# Patient Record
Sex: Male | Born: 1990 | Race: Black or African American | Hispanic: No | Marital: Single | Smoking: Never smoker
Health system: Southern US, Community
[De-identification: ages and names within clinical notes are randomized; demographics above are authoritative.]

## PROBLEM LIST (undated history)

## (undated) DIAGNOSIS — K219 Gastro-esophageal reflux disease without esophagitis: Secondary | ICD-10-CM

## (undated) DIAGNOSIS — J45909 Unspecified asthma, uncomplicated: Secondary | ICD-10-CM

## (undated) HISTORY — PX: TONSILLECTOMY: SUR1361

---

## 2017-10-27 ENCOUNTER — Encounter

## 2017-10-27 ENCOUNTER — Ambulatory Visit: Payer: Self-pay | Admitting: Allergy

## 2017-12-03 DIAGNOSIS — Z21 Asymptomatic human immunodeficiency virus [HIV] infection status: Secondary | ICD-10-CM | POA: Insufficient documentation

## 2018-01-19 DIAGNOSIS — R030 Elevated blood-pressure reading, without diagnosis of hypertension: Secondary | ICD-10-CM | POA: Insufficient documentation

## 2018-05-07 DIAGNOSIS — K580 Irritable bowel syndrome with diarrhea: Secondary | ICD-10-CM | POA: Insufficient documentation

## 2018-06-03 DIAGNOSIS — F330 Major depressive disorder, recurrent, mild: Secondary | ICD-10-CM | POA: Insufficient documentation

## 2018-07-23 DIAGNOSIS — F900 Attention-deficit hyperactivity disorder, predominantly inattentive type: Secondary | ICD-10-CM | POA: Insufficient documentation

## 2018-11-10 DIAGNOSIS — K219 Gastro-esophageal reflux disease without esophagitis: Secondary | ICD-10-CM | POA: Insufficient documentation

## 2019-01-10 DIAGNOSIS — R55 Syncope and collapse: Secondary | ICD-10-CM | POA: Insufficient documentation

## 2019-05-09 DIAGNOSIS — F4001 Agoraphobia with panic disorder: Secondary | ICD-10-CM | POA: Insufficient documentation

## 2019-07-04 DIAGNOSIS — F603 Borderline personality disorder: Secondary | ICD-10-CM | POA: Insufficient documentation

## 2020-05-29 DIAGNOSIS — M5412 Radiculopathy, cervical region: Secondary | ICD-10-CM | POA: Insufficient documentation

## 2020-05-29 DIAGNOSIS — G5600 Carpal tunnel syndrome, unspecified upper limb: Secondary | ICD-10-CM | POA: Insufficient documentation

## 2021-04-23 ENCOUNTER — Other Ambulatory Visit: Payer: Self-pay

## 2021-04-23 ENCOUNTER — Emergency Department (HOSPITAL_COMMUNITY): Payer: No Typology Code available for payment source

## 2021-04-23 ENCOUNTER — Observation Stay (HOSPITAL_COMMUNITY)
Admission: EM | Admit: 2021-04-23 | Discharge: 2021-04-24 | Disposition: A | Discharge status: Home / Self Care | Payer: No Typology Code available for payment source | Attending: Neurological Surgery | Admitting: Neurological Surgery

## 2021-04-23 ENCOUNTER — Encounter (HOSPITAL_COMMUNITY): Payer: Self-pay

## 2021-04-23 DIAGNOSIS — S066X9A Traumatic subarachnoid hemorrhage with loss of consciousness of unspecified duration, initial encounter: Secondary | ICD-10-CM | POA: Diagnosis not present

## 2021-04-23 DIAGNOSIS — Z20822 Contact with and (suspected) exposure to covid-19: Secondary | ICD-10-CM | POA: Diagnosis not present

## 2021-04-23 DIAGNOSIS — B20 Human immunodeficiency virus [HIV] disease: Secondary | ICD-10-CM | POA: Insufficient documentation

## 2021-04-23 DIAGNOSIS — Z23 Encounter for immunization: Secondary | ICD-10-CM | POA: Diagnosis not present

## 2021-04-23 DIAGNOSIS — J45909 Unspecified asthma, uncomplicated: Secondary | ICD-10-CM | POA: Insufficient documentation

## 2021-04-23 DIAGNOSIS — T1490XA Injury, unspecified, initial encounter: Secondary | ICD-10-CM

## 2021-04-23 DIAGNOSIS — S01112A Laceration without foreign body of left eyelid and periocular area, initial encounter: Secondary | ICD-10-CM | POA: Insufficient documentation

## 2021-04-23 DIAGNOSIS — Y900 Blood alcohol level of less than 20 mg/100 ml: Secondary | ICD-10-CM | POA: Diagnosis not present

## 2021-04-23 DIAGNOSIS — Y92410 Unspecified street and highway as the place of occurrence of the external cause: Secondary | ICD-10-CM | POA: Diagnosis not present

## 2021-04-23 DIAGNOSIS — Z79899 Other long term (current) drug therapy: Secondary | ICD-10-CM | POA: Insufficient documentation

## 2021-04-23 DIAGNOSIS — I609 Nontraumatic subarachnoid hemorrhage, unspecified: Secondary | ICD-10-CM

## 2021-04-23 HISTORY — DX: Unspecified asthma, uncomplicated: J45.909

## 2021-04-23 HISTORY — DX: Gastro-esophageal reflux disease without esophagitis: K21.9

## 2021-04-23 LAB — CBC
HCT: 41.9 % (ref 39.0–52.0)
Hemoglobin: 14.2 g/dL (ref 13.0–17.0)
MCH: 29.2 pg (ref 26.0–34.0)
MCHC: 33.9 g/dL (ref 30.0–36.0)
MCV: 86 fL (ref 80.0–100.0)
Platelets: 167 10*3/uL (ref 150–400)
RBC: 4.87 MIL/uL (ref 4.22–5.81)
RDW: 13.6 % (ref 11.5–15.5)
WBC: 5.2 10*3/uL (ref 4.0–10.5)
nRBC: 0 % (ref 0.0–0.2)

## 2021-04-23 LAB — I-STAT CHEM 8, ED
BUN: 18 mg/dL (ref 6–20)
Calcium, Ion: 1.13 mmol/L — ABNORMAL LOW (ref 1.15–1.40)
Chloride: 105 mmol/L (ref 98–111)
Creatinine, Ser: 1.1 mg/dL (ref 0.61–1.24)
Glucose, Bld: 96 mg/dL (ref 70–99)
HCT: 42 % (ref 39.0–52.0)
Hemoglobin: 14.3 g/dL (ref 13.0–17.0)
Potassium: 3.7 mmol/L (ref 3.5–5.1)
Sodium: 141 mmol/L (ref 135–145)
TCO2: 25 mmol/L (ref 22–32)

## 2021-04-23 LAB — SAMPLE TO BLOOD BANK

## 2021-04-23 LAB — COMPREHENSIVE METABOLIC PANEL
ALT: 45 U/L — ABNORMAL HIGH (ref 0–44)
AST: 33 U/L (ref 15–41)
Albumin: 4.2 g/dL (ref 3.5–5.0)
Alkaline Phosphatase: 56 U/L (ref 38–126)
Anion gap: 8 (ref 5–15)
BUN: 16 mg/dL (ref 6–20)
CO2: 24 mmol/L (ref 22–32)
Calcium: 9.2 mg/dL (ref 8.9–10.3)
Chloride: 103 mmol/L (ref 98–111)
Creatinine, Ser: 1.08 mg/dL (ref 0.61–1.24)
GFR, Estimated: >60 mL/min (ref >60)
Glucose, Bld: 103 mg/dL — ABNORMAL HIGH (ref 70–99)
Potassium: 3.6 mmol/L (ref 3.5–5.1)
Sodium: 135 mmol/L (ref 135–145)
Total Bilirubin: 0.8 mg/dL (ref 0.3–1.2)
Total Protein: 6.8 g/dL (ref 6.5–8.1)

## 2021-04-23 LAB — URINALYSIS, ROUTINE W REFLEX MICROSCOPIC
Bacteria, UA: NONE SEEN
Bilirubin Urine: NEGATIVE
Glucose, UA: NEGATIVE mg/dL
Ketones, ur: NEGATIVE mg/dL
Leukocytes,Ua: NEGATIVE
Nitrite: NEGATIVE
Protein, ur: NEGATIVE mg/dL
Specific Gravity, Urine: 1.012 (ref 1.005–1.030)
pH: 6 (ref 5.0–8.0)

## 2021-04-23 LAB — RESP PANEL BY RT-PCR (FLU A&B, COVID) ARPGX2
Influenza A by PCR: NEGATIVE
Influenza B by PCR: NEGATIVE
SARS Coronavirus 2 by RT PCR: NEGATIVE

## 2021-04-23 LAB — MAGNESIUM: Magnesium: 1.8 mg/dL (ref 1.7–2.4)

## 2021-04-23 LAB — LACTIC ACID, PLASMA: Lactic Acid, Venous: 1.1 mmol/L (ref 0.5–1.9)

## 2021-04-23 LAB — PROTIME-INR
INR: 1 (ref 0.8–1.2)
Prothrombin Time: 13.4 seconds (ref 11.4–15.2)

## 2021-04-23 MED ORDER — FENTANYL CITRATE PF 50 MCG/ML IJ SOSY
50.0000 ug | PREFILLED_SYRINGE | INTRAMUSCULAR | Status: DC | PRN
Start: 2021-04-23 — End: 2021-04-23

## 2021-04-23 MED ORDER — HYDROMORPHONE HCL 1 MG/ML IJ SOLN: 0.5000 mg | INTRAMUSCULAR | Status: DC | PRN

## 2021-04-23 MED ORDER — ACETAMINOPHEN 650 MG RE SUPP: 650.0000 mg | Freq: Four times a day (QID) | RECTAL | Status: DC | PRN

## 2021-04-23 MED ORDER — BICTEGRAVIR-EMTRICITAB-TENOFOV 50-200-25 MG PO TABS
1.0000 | ORAL_TABLET | Freq: Every day | ORAL | Status: DC
  Administered 2021-04-23: 1 via ORAL
  Filled 2021-04-23 (×2): qty 1

## 2021-04-23 MED ORDER — FLEET ENEMA 7-19 GM/118ML RE ENEM: 1.0000 | ENEMA | Freq: Once | RECTAL | Status: DC | PRN

## 2021-04-23 MED ORDER — ACETAMINOPHEN 325 MG PO TABS
650.0000 mg | ORAL_TABLET | Freq: Four times a day (QID) | ORAL | Status: DC | PRN
  Administered 2021-04-23: 650 mg via ORAL
  Filled 2021-04-23: qty 2

## 2021-04-23 MED ORDER — IOHEXOL 300 MG/ML  SOLN
100.0000 mL | Freq: Once | INTRAMUSCULAR | Status: AC | PRN
  Administered 2021-04-23: 100 mL via INTRAVENOUS

## 2021-04-23 MED ORDER — POLYETHYLENE GLYCOL 3350 17 G PO PACK: 17.0000 g | PACK | Freq: Every day | ORAL | Status: DC | PRN

## 2021-04-23 MED ORDER — ACETAMINOPHEN 500 MG PO TABS
1000.0000 mg | ORAL_TABLET | Freq: Once | ORAL | Status: AC
  Administered 2021-04-23: 1000 mg via ORAL
  Filled 2021-04-23: qty 2

## 2021-04-23 MED ORDER — ONDANSETRON HCL 4 MG/2ML IJ SOLN
4.0000 mg | Freq: Four times a day (QID) | INTRAMUSCULAR | Status: DC | PRN
  Administered 2021-04-23: 4 mg via INTRAVENOUS
  Filled 2021-04-23: qty 2

## 2021-04-23 MED ORDER — LACTATED RINGERS IV BOLUS
1000.0000 mL | Freq: Once | INTRAVENOUS | Status: AC
  Administered 2021-04-23: 1000 mL via INTRAVENOUS

## 2021-04-23 MED ORDER — DOCUSATE SODIUM 100 MG PO CAPS
100.0000 mg | ORAL_CAPSULE | Freq: Two times a day (BID) | ORAL | Status: DC
  Administered 2021-04-23 (×2): 100 mg via ORAL
  Filled 2021-04-23 (×2): qty 1

## 2021-04-23 MED ORDER — LEVETIRACETAM IN NACL 500 MG/100ML IV SOLN
500.0000 mg | Freq: Two times a day (BID) | INTRAVENOUS | Status: DC
  Administered 2021-04-23 (×2): 500 mg via INTRAVENOUS
  Filled 2021-04-23 (×2): qty 100

## 2021-04-23 MED ORDER — LIDOCAINE-EPINEPHRINE-TETRACAINE (LET) TOPICAL GEL
3.0000 mL | Freq: Once | TOPICAL | Status: AC
  Administered 2021-04-23: 3 mL via TOPICAL
  Filled 2021-04-23: qty 3

## 2021-04-23 MED ORDER — NORTRIPTYLINE HCL 25 MG PO CAPS
75.0000 mg | ORAL_CAPSULE | Freq: Every day | ORAL | Status: DC
  Administered 2021-04-23: 75 mg via ORAL
  Filled 2021-04-23: qty 3

## 2021-04-23 MED ORDER — BISACODYL 10 MG RE SUPP: 10.0000 mg | Freq: Every day | RECTAL | Status: DC | PRN

## 2021-04-23 MED ORDER — LIDOCAINE-EPINEPHRINE (PF) 2 %-1:200000 IJ SOLN
10.0000 mL | Freq: Once | INTRAMUSCULAR | Status: AC
  Administered 2021-04-23: 10 mL via INTRADERMAL
  Filled 2021-04-23: qty 20

## 2021-04-23 MED ORDER — TETANUS-DIPHTH-ACELL PERTUSSIS 5-2.5-18.5 LF-MCG/0.5 IM SUSY
0.5000 mL | PREFILLED_SYRINGE | Freq: Once | INTRAMUSCULAR | Status: AC
  Administered 2021-04-23: 0.5 mL via INTRAMUSCULAR
  Filled 2021-04-23: qty 0.5

## 2021-04-23 MED ORDER — HYDROCODONE-ACETAMINOPHEN 5-325 MG PO TABS
1.0000 | ORAL_TABLET | ORAL | Status: DC | PRN
  Administered 2021-04-23: 2 via ORAL
  Administered 2021-04-23: 1 via ORAL
  Filled 2021-04-23: qty 1
  Filled 2021-04-23: qty 2

## 2021-04-23 MED ORDER — METHOCARBAMOL 1000 MG/10ML IJ SOLN
500.0000 mg | Freq: Four times a day (QID) | INTRAVENOUS | Status: DC | PRN
  Administered 2021-04-23: 500 mg via INTRAVENOUS
  Filled 2021-04-23: qty 5
  Filled 2021-04-23: qty 500

## 2021-04-23 NOTE — ED Notes (Signed)
Pt ambulatory to and from restroom with steady gait 
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
## 2021-04-23 NOTE — ED Notes (Signed)
Up to b/r, steady gait 
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
## 2021-04-23 NOTE — ED Notes (Signed)
Given water, and BS tabletop fan.

## 2021-04-23 NOTE — ED Notes (Signed)
Resting/ sleeping, arousable to voice, prefers eyes closed, interactive, speech clear, coherent, VSS. Mentions neck tightness, given robaxin IV.

## 2021-04-23 NOTE — ED Notes (Signed)
Alert, NAD, calm, interactive, mentions HA and slight nausea, given meds. Family x2 at [facility]. VSS.
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
## 2021-04-23 NOTE — H&P (Signed)
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 abnormalities. Maxillofacial CT was also unremarkable. In addition to the finding on his [facility], his physical exam revealed a right parietal hematoma, left eyebrow laceration, left periorbital bruising and swelling, and a small superficial laceration to his right proximal anterior shin.    While in the ED, he has remained neurologically intact and at his baseline. His only complaints are of left periorbital pain and mild neck pain. He is unable to open his left eye due to the periorbital edema. Patient will be admitted for overnight observation with frequent neuro checks and repeat CTH in the morning. If his neurological exam and imaging remain unchanged, he will be appropriate for discharge tomorrow morning.    -Admit to inpatient  for observation -Frequent neuro checks  -Repeat head CT in the morning -Keppra 500 mg IVPB Q12H  Marvis Moeller, NP 04/23/2021,  11:36 AM

## 2021-04-23 NOTE — ED Notes (Signed)
Pt to ct 

## 2021-04-23 NOTE — ED Notes (Signed)
Denies HA, pain or nausea. Visiting with 2 new visitors at [facility].

## 2021-04-24 ENCOUNTER — Observation Stay (HOSPITAL_COMMUNITY): Payer: No Typology Code available for payment source

## 2021-04-24 LAB — ETHANOL: Alcohol, Ethyl (B): <10 mg/dL (ref <10)
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
## 2021-04-24 MED ORDER — METHOCARBAMOL 500 MG PO TABS: 500.0000 mg | ORAL_TABLET | Freq: Four times a day (QID) | ORAL | 0 refills | Status: DC

## 2021-04-24 MED ORDER — LEVETIRACETAM 500 MG PO TABS: 500.0000 mg | ORAL_TABLET | Freq: Two times a day (BID) | ORAL | 0 refills | Status: DC

## 2021-04-24 NOTE — ED Notes (Signed)
Attempted report 
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
## 2021-04-24 NOTE — Progress Notes (Signed)
Pt with discharge orders, discharge paperwork reviewed with patient and all questions answered. IV's removed. Pt taken down via wheelchair with all belongings.   °

## 2021-04-24 NOTE — Progress Notes (Signed)
Pt. Off floor with CT

## 2021-04-24 NOTE — TOC CAGE-AID Note (Signed)
Transition of Care [facility]) - CAGE-AID Screening   Patient Details  Name: Charles Dixon MRN: [id number] Date of Birth: Sep 12, 1990  Transition of Care [facility]) CM/SW Contact:    Lossie Faes Tarpley-Carter, LCSWA Phone Number: 04/24/2021, 12:10 PM   Clinical Narrative: Pt participated in Cage-Aid.  Pt stated he does not use substance, but drinks ETOH. Pt was offered resources.  Pt stated they did not feel that they were in need of resources at this time.    Criag Wicklund Tarpley-Carter, MSW, LCSW-A Pronouns:  She/Her/Hers [facility] HealthTransitions of Care Clinical Social Worker Direct Number:  [phone] [email or web address]  CAGE-AID Screening:    Have You Ever Felt You Ought to Cut Down on Your Drinking or Drug Use?: No Have People Annoyed You By [employer] Your Drinking Or Drug Use?: No Have You Felt Bad Or Guilty About Your Drinking Or Drug Use?: No Have You Ever Had a Drink or Used Drugs First Thing In The Morning to Steady Your Nerves or to Get Rid of a Hangover?: No CAGE-AID Score: 0  Substance Abuse Education Offered: Yes  Substance abuse interventions: Transport planner

## 2021-05-02 ENCOUNTER — Telehealth: Payer: Self-pay | Admitting: Family Medicine
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
## 2021-05-08 ENCOUNTER — Other Ambulatory Visit: Payer: Self-pay

## 2021-05-08 ENCOUNTER — Ambulatory Visit (INDEPENDENT_AMBULATORY_CARE_PROVIDER_SITE_OTHER): Payer: Self-pay | Admitting: Family Medicine

## 2021-05-08 ENCOUNTER — Encounter: Payer: Self-pay | Admitting: Family Medicine

## 2021-05-08 VITALS — BP 104/68 | HR 87 | Ht 73.0 in | Wt 265.6 lb

## 2021-05-08 DIAGNOSIS — R42 Dizziness and giddiness: Secondary | ICD-10-CM

## 2021-05-08 DIAGNOSIS — S060X9D Concussion with loss of consciousness of unspecified duration, subsequent encounter: Secondary | ICD-10-CM

## 2021-05-08 DIAGNOSIS — H539 Unspecified visual disturbance: Secondary | ICD-10-CM

## 2021-05-08 DIAGNOSIS — I609 Nontraumatic subarachnoid hemorrhage, unspecified: Secondary | ICD-10-CM

## 2021-05-08 DIAGNOSIS — S060X9A Concussion with loss of consciousness of unspecified duration, initial encounter: Secondary | ICD-10-CM | POA: Insufficient documentation

## 2021-05-08 MED ORDER — TRAZODONE HCL 50 MG PO TABS: 50.0000 mg | ORAL_TABLET | Freq: Every day | ORAL | 1 refills | Status: AC

## 2021-05-08 NOTE — Patient Instructions (Addendum)
Nice to meet you.  I've referred you to both speech and vestibular therapy at the [facility].  They will call you to schedule but let us know if you don't hear from them in the next week regarding scheduling.  Trazdone 50 mg at bedtime as needed.  Follow-up: 3 weeks

## 2021-05-08 NOTE — Addendum Note (Signed)
Addended by: Rodolph Bong on: 05/08/2021 09:56 AM   Modules accepted: Orders

## 2021-05-24 ENCOUNTER — Ambulatory Visit: Payer: PRIVATE HEALTH INSURANCE | Attending: Family Medicine

## 2021-05-24 ENCOUNTER — Other Ambulatory Visit: Payer: Self-pay

## 2021-05-24 ENCOUNTER — Ambulatory Visit: Payer: PRIVATE HEALTH INSURANCE

## 2021-05-24 DIAGNOSIS — R4701 Aphasia: Secondary | ICD-10-CM

## 2021-05-24 DIAGNOSIS — R41841 Cognitive communication deficit: Secondary | ICD-10-CM | POA: Diagnosis present

## 2021-05-24 DIAGNOSIS — R42 Dizziness and giddiness: Secondary | ICD-10-CM | POA: Diagnosis present

## 2021-05-24 DIAGNOSIS — S060X9D Concussion with loss of consciousness of unspecified duration, subsequent encounter: Secondary | ICD-10-CM | POA: Insufficient documentation

## 2021-05-24 DIAGNOSIS — R293 Abnormal posture: Secondary | ICD-10-CM | POA: Diagnosis present

## 2021-05-24 DIAGNOSIS — R2681 Unsteadiness on feet: Secondary | ICD-10-CM | POA: Insufficient documentation

## 2021-05-24 DIAGNOSIS — R2689 Other abnormalities of gait and mobility: Secondary | ICD-10-CM | POA: Insufficient documentation
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
## 2021-05-27 ENCOUNTER — Ambulatory Visit: Payer: PRIVATE HEALTH INSURANCE

## 2021-05-29 ENCOUNTER — Ambulatory Visit: Payer: Medicaid Other | Admitting: Family Medicine

## 2021-05-31 ENCOUNTER — Ambulatory Visit: Payer: Medicaid Other | Admitting: Family Medicine

## 2021-05-31 ENCOUNTER — Ambulatory Visit: Payer: PRIVATE HEALTH INSURANCE | Admitting: Physical Therapy

## 2021-05-31 ENCOUNTER — Ambulatory Visit: Payer: PRIVATE HEALTH INSURANCE

## 2021-05-31 NOTE — Progress Notes (Deleted)
Subjective:    Chief Complaint: Charles Dixon,  is a 30 y.o. male who presents for Concussion  ***  Injury date : *** Visit #: ***   History of Present Illness:    Concussion Self-Reported Symptom Score Symptoms rated on a scale 1-6, in last 24 hours   Headache: ***    Nausea: ***  Dizziness: ***  Vomiting: ***  Balance Difficulty: ***   Trouble Falling Asleep: ***   Fatigue: ***  Sleep Less Than Usual: ***  Daytime Drowsiness: ***  Sleep More Than Usual: ***  Photophobia: ***  Phonophobia: ***  Irritability: ***  Sadness: ***  Numbness or Tingling: ***  Nervousness: ***  Feeling More Emotional: ***  Feeling Mentally Foggy: ***  Feeling Slowed Down: ***  Memory Problems: ***  Difficulty Concentrating: ***  Visual Problems: ***   Total Symptom Score: *** Previous Symptom Score: ***   Neck Pain: Yes/No  Tinnitus: Yes/No  Review of Systems:  ***    Review of History: ***  Objective:    Physical Examination There were no vitals filed for this visit. MSK:  *** Neuro: *** Psych: ***     Imaging:  ***  Assessment and Plan   30 y.o. male with ***    ***    Action/Discussion: Reviewed diagnosis, management options, expected outcomes, and the reasons for scheduled and emergent follow-up. Questions were adequately answered. Patient expressed verbal understanding and agreement with the following plan.     Patient Education: Reviewed with patient the risks (i.e, a repeat concussion, post-concussion syndrome, second-impact syndrome) of returning to play prior to complete resolution, and thoroughly reviewed the signs and symptoms of concussion.Reviewed need for complete resolution of all symptoms, with rest AND exertion, prior to return to play. Reviewed red flags for urgent medical evaluation: worsening symptoms, nausea/vomiting, intractable headache, musculoskeletal changes, focal neurological deficits. [facility] Concussion Care  Plan, which clearly outlines the plans stated above, was given to patient.   Level of service: ***     After Visit Summary printed out and provided to patient as appropriate.  The above documentation has been reviewed and is accurate and complete Wilford Grist

## 2021-06-05 ENCOUNTER — Ambulatory Visit: Payer: PRIVATE HEALTH INSURANCE

## 2021-06-05 ENCOUNTER — Other Ambulatory Visit: Payer: Self-pay

## 2021-06-05 NOTE — Therapy (Addendum)
[facility] [address] [place], [place], [address] Phone: [phone]   Fax:  [phone]  Patient Details  Name: Charles Dixon MRN: [id number] Date of Birth: 1990/08/21 Referring Provider:  Dr Denyse Amass  Encounter Date: 06/05/2021  Called phone number provided in the chart due to 2 recent no-shows for both ST and PT. No answer, so SLP left voicemail. SLP provided information about clinic no-show policy. Informed that 3 no-shows would result in remaining appointments being cancelled and pt would need to schedule them one at a time from that point. SLP provided next scheduled visits. SLP stated option to call front office to re-schedule remaining appointments for different times/dates if current scheduled times were no longer ideal for them. Recommended return phone call to front office staff re: scheduling.  Janann Colonel, CCC-SLP 06/05/2021, 9:11 AM  [facility] [address] [place], [place], [address] Phone: [phone]   Fax:  [phone]

## 2021-06-06 ENCOUNTER — Ambulatory Visit: Payer: PRIVATE HEALTH INSURANCE

## 2021-06-14 ENCOUNTER — Encounter: Payer: No Typology Code available for payment source | Admitting: Physical Therapy

## 2021-06-19 ENCOUNTER — Ambulatory Visit: Payer: No Typology Code available for payment source

## 2021-06-21 ENCOUNTER — Ambulatory Visit: Payer: No Typology Code available for payment source

## 2021-06-25 ENCOUNTER — Ambulatory Visit: Payer: No Typology Code available for payment source

## 2021-06-28 ENCOUNTER — Encounter: Payer: No Typology Code available for payment source | Admitting: Physical Therapy

## 2021-07-03 ENCOUNTER — Ambulatory Visit: Payer: No Typology Code available for payment source

## 2021-07-04 ENCOUNTER — Ambulatory Visit: Payer: No Typology Code available for payment source

## 2021-07-08 ENCOUNTER — Encounter: Payer: No Typology Code available for payment source | Admitting: Physical Therapy

## 2021-07-12 ENCOUNTER — Encounter: Payer: No Typology Code available for payment source | Admitting: Physical Therapy

## 2021-07-17 ENCOUNTER — Ambulatory Visit: Payer: No Typology Code available for payment source

## 2021-07-18 ENCOUNTER — Encounter: Payer: No Typology Code available for payment source | Admitting: Physical Therapy

## 2022-02-14 ENCOUNTER — Ambulatory Visit: Payer: No Typology Code available for payment source | Admitting: Family Medicine
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
## 2022-06-18 ENCOUNTER — Emergency Department (HOSPITAL_COMMUNITY): Payer: 59

## 2022-06-18 ENCOUNTER — Emergency Department (HOSPITAL_COMMUNITY)
Admission: EM | Admit: 2022-06-18 | Discharge: 2022-06-18 | Disposition: A | Discharge status: Home / Self Care | Payer: 59 | Attending: Emergency Medicine | Admitting: Emergency Medicine

## 2022-06-18 ENCOUNTER — Other Ambulatory Visit: Payer: Self-pay

## 2022-06-18 DIAGNOSIS — K529 Noninfective gastroenteritis and colitis, unspecified: Secondary | ICD-10-CM | POA: Insufficient documentation

## 2022-06-18 DIAGNOSIS — R1010 Upper abdominal pain, unspecified: Secondary | ICD-10-CM | POA: Diagnosis present

## 2022-06-18 LAB — COMPREHENSIVE METABOLIC PANEL
ALT: 20 U/L (ref 0–44)
AST: 23 U/L (ref 15–41)
Albumin: 4.2 g/dL (ref 3.5–5.0)
Alkaline Phosphatase: 37 U/L — ABNORMAL LOW (ref 38–126)
Anion gap: 6 (ref 5–15)
BUN: 17 mg/dL (ref 6–20)
CO2: 23 mmol/L (ref 22–32)
Calcium: 8.9 mg/dL (ref 8.9–10.3)
Chloride: 107 mmol/L (ref 98–111)
Creatinine, Ser: 1.03 mg/dL (ref 0.61–1.24)
GFR, Estimated: >60 mL/min (ref >60)
Glucose, Bld: 104 mg/dL — ABNORMAL HIGH (ref 70–99)
Potassium: 3.9 mmol/L (ref 3.5–5.1)
Sodium: 136 mmol/L (ref 135–145)
Total Bilirubin: 1.3 mg/dL — ABNORMAL HIGH (ref 0.3–1.2)
Total Protein: 6.8 g/dL (ref 6.5–8.1)

## 2022-06-18 LAB — URINALYSIS, ROUTINE W REFLEX MICROSCOPIC
Bilirubin Urine: NEGATIVE
Glucose, UA: NEGATIVE mg/dL
Ketones, ur: NEGATIVE mg/dL
Nitrite: NEGATIVE
Protein, ur: 30 mg/dL — AB
RBC / HPF: >50 RBC/hpf — ABNORMAL HIGH (ref 0–5)
Specific Gravity, Urine: 1.019 (ref 1.005–1.030)
pH: 5 (ref 5.0–8.0)

## 2022-06-18 LAB — CBC WITH DIFFERENTIAL/PLATELET
Abs Immature Granulocytes: 0.01 10*3/uL (ref 0.00–0.07)
Basophils Absolute: 0 10*3/uL (ref 0.0–0.1)
Basophils Relative: 0 %
Eosinophils Absolute: 0.1 10*3/uL (ref 0.0–0.5)
Eosinophils Relative: 2 %
HCT: 43.2 % (ref 39.0–52.0)
Hemoglobin: 14.4 g/dL (ref 13.0–17.0)
Immature Granulocytes: 0 %
Lymphocytes Relative: 9 %
Lymphs Abs: 0.4 10*3/uL — ABNORMAL LOW (ref 0.7–4.0)
MCH: 29 pg (ref 26.0–34.0)
MCHC: 33.3 g/dL (ref 30.0–36.0)
MCV: 86.9 fL (ref 80.0–100.0)
Monocytes Absolute: 0.3 10*3/uL (ref 0.1–1.0)
Monocytes Relative: 7 %
Neutro Abs: 3.3 10*3/uL (ref 1.7–7.7)
Neutrophils Relative %: 82 %
Platelets: 164 10*3/uL (ref 150–400)
RBC: 4.97 MIL/uL (ref 4.22–5.81)
RDW: 13.2 % (ref 11.5–15.5)
WBC: 4 10*3/uL (ref 4.0–10.5)
nRBC: 0 % (ref 0.0–0.2)

## 2022-06-18 LAB — LIPASE, BLOOD: Lipase: 24 U/L (ref 11–51)

## 2022-06-18 MED ORDER — ONDANSETRON 4 MG PO TBDP
4.0000 mg | ORAL_TABLET | Freq: Once | ORAL | Status: AC
  Administered 2022-06-18: 4 mg via ORAL
  Filled 2022-06-18: qty 1

## 2022-06-18 MED ORDER — LACTATED RINGERS IV BOLUS
1000.0000 mL | Freq: Once | INTRAVENOUS | Status: AC
  Administered 2022-06-18: 1000 mL via INTRAVENOUS

## 2022-06-18 MED ORDER — ONDANSETRON HCL 4 MG/2ML IJ SOLN
4.0000 mg | Freq: Once | INTRAMUSCULAR | Status: AC
  Administered 2022-06-18: 4 mg via INTRAVENOUS
  Filled 2022-06-18: qty 2

## 2022-06-18 MED ORDER — IOHEXOL 350 MG/ML SOLN
75.0000 mL | Freq: Once | INTRAVENOUS | Status: AC | PRN
  Administered 2022-06-18: 75 mL via INTRAVENOUS

## 2022-06-18 MED ORDER — ONDANSETRON 4 MG PO TBDP: 4.0000 mg | ORAL_TABLET | Freq: Three times a day (TID) | ORAL | 0 refills | Status: AC | PRN

## 2022-06-18 NOTE — ED Triage Notes (Signed)
Pt. Stated, I woke up around 400 with N/V/D and My stomach is hurting.

## 2022-06-18 NOTE — ED Provider Notes (Signed)
Gastric pain vomiting and diarrhea.  CT unremarkable.  Plan for p.o. challenge and reassess for potential discharge. Physical Exam  BP 115/70   Pulse 80   Temp 98.7 F (37.1 C)   Resp 16   Ht 6' (1.829 m)   Wt 130.6 kg   SpO2 98%   BMI 39.06 kg/m   Physical Exam  Procedures  Procedures  ED Course / MDM    Medical Decision Making Amount and/or Complexity of Data Reviewed Labs: ordered. Radiology: ordered.  Risk Prescription drug management.   Patient is tolerating oral intake and feels much better.  Requesting discharge at this time.       Arby Barrette, MD 06/18/22 [phone]

## 2022-06-18 NOTE — ED Notes (Signed)
Patient verbalizes understanding of discharge instructions. Opportunity for questioning and answers were provided. Pt discharged from ED. 

## 2022-06-18 NOTE — Discharge Instructions (Signed)
It appears you have a viral gastroenteritis.  Start with a clear liquid diet and advance slowly as tolerated.  Take the nausea medication as prescribed.  Follow-up with your doctor for recheck if you are not improving.  Return to the ED with worsening symptoms or unable to eat or drink

## 2022-06-18 NOTE — ED Provider Triage Note (Signed)
Emergency Medicine Provider Triage Evaluation Note  Charles Dixon , a 31 y.o. male  was evaluated in triage.  Pt complains of abdominal pain with vomiting and diarrhea since 4 AM.  Felt well going to bed last night.  Emesis is nonbloody.  History of HIV with undetectable counts..  Review of Systems  Positive: Vomiting, diarrhea, upper abdominal pain Negative: Fever, travel or sick contacts  Physical Exam  BP 131/82   Pulse 90   Temp 98.3 F (36.8 C)   Resp 20   SpO2 99%  Gen:   Awake, no distress   Resp:  Normal effort  MSK:   Moves extremities without difficulty  Other:  Epigastric tenderness, no guarding or rebound  Medical Decision Making  Medically screening exam initiated at 9:00 AM.  Appropriate orders placed.  Charles Dixon was informed that the remainder of the evaluation will be completed by another provider, this initial triage assessment does not replace that evaluation, and the importance of remaining in the ED until their evaluation is complete.     Charles Octave, MD 06/18/22 [phone]

## 2022-06-18 NOTE — ED Provider Notes (Signed)
[facility] EMERGENCY DEPARTMENT Provider Note   CSN: [id number] Arrival date & time: 06/18/22  1660     History  Chief Complaint  Patient presents with   Abdominal Pain   Emesis   Nausea   Diarrhea    Charles Dixon is a 31 y.o. male.  Patient with a history of HIV with undetectable counts by his report presenting with upper abdominal pain with vomiting and diarrhea since 4 AM.  States he vomited 4-5 times that was nonbloody and nonbilious.  Also had multiple episodes of diarrhea as well.  Diffuse crampy abdominal pain.  No further vomiting since initially seen in triage at about 9 AM.  No fever.  Still having upper abdominal pain and nausea.  No travel or sick contacts.  No recent antibiotic use.  No previous abdominal surgeries.  No chest pain or shortness of breath.  No pain with urination or blood in the urine.  No known fever.  The history is provided by the patient.  Abdominal Pain Associated symptoms: diarrhea, nausea and vomiting   Associated symptoms: no cough, no dysuria, no fatigue, no hematuria and no shortness of breath   Emesis Associated symptoms: abdominal pain and diarrhea   Associated symptoms: no arthralgias, no cough, no headaches and no myalgias   Diarrhea Associated symptoms: abdominal pain and vomiting   Associated symptoms: no arthralgias, no headaches and no myalgias        Home Medications Prior to Admission medications   Medication Sig Start Date End Date Taking? Authorizing Provider  amphetamine-dextroamphetamine (ADDERALL XR) 20 MG 24 hr capsule Take 40 mg by mouth every morning. 02/19/21   [provider]  bictegravir-emtricitabine-tenofovir AF (BIKTARVY) 50-200-25 MG TABS tablet Take 1 tablet by mouth daily.    [provider]  DANDELION ROOT PO Take 2 capsules by mouth daily.    [provider]  FIBER PO Take 1 capsule by mouth in the morning.    [provider]  Ginger, Zingiber  officinalis, (GINGER ROOT) 500 MG CAPS Take 2 capsules by mouth daily.    [provider]  Misc Natural Products (OSTEO BI-FLEX ADV DOUBLE ST PO) Take 2 capsules by mouth daily.    [provider]  nortriptyline (PAMELOR) 75 MG capsule Take 75 mg by mouth at bedtime. 03/17/21   [provider]  OVER THE COUNTER MEDICATION Take 2 capsules by mouth daily. Marshmallow Root    [provider]  OVER THE COUNTER MEDICATION Take 2 capsules by mouth in the morning and at bedtime. [facility]    [provider]  OVER THE COUNTER MEDICATION Take 1 capsule by mouth in the morning and at bedtime. Neem    [provider]  OVER THE COUNTER MEDICATION Take 2 capsules by mouth daily. [facility]    [provider]  OVER THE COUNTER MEDICATION Take 2 capsules by mouth daily. Nettle    [provider]  OVER THE COUNTER MEDICATION Take 2 capsules by mouth daily. Burdock Root    [provider]  RED CLOVER LEAF EXTRACT PO Take 2 capsules by mouth daily.    [provider]  traZODone (DESYREL) 50 MG tablet Take 1 tablet (50 mg total) by mouth at bedtime. 05/08/21   Rodolph Bong, MD      Allergies    Penicillins and Other    Review of Systems   Review of Systems  Constitutional:  Positive for activity change and appetite  change. Negative for fatigue.  HENT:  Negative for congestion and rhinorrhea.   Respiratory:  Negative for cough, chest tightness and shortness of breath.   Gastrointestinal:  Positive for abdominal pain, diarrhea, nausea and vomiting.  Genitourinary:  Negative for dysuria and hematuria.  Musculoskeletal:  Negative for arthralgias and myalgias.  Neurological:  Negative for weakness and headaches.   all other systems are negative except as noted in the HPI and PMH.    Physical Exam Updated Vital Signs BP 116/76 (BP Location: Right Arm)   Pulse 84   Temp 98.7 F (37.1 C)   Resp 16   Ht 6'  (1.829 m)   Wt 130.6 kg   SpO2 98%   BMI 39.06 kg/m  Physical Exam Vitals and nursing note reviewed.  Constitutional:      General: He is not in acute distress.    Appearance: He is well-developed.  HENT:     Head: Normocephalic and atraumatic.     Mouth/Throat:     Pharynx: No oropharyngeal exudate.  Eyes:     Conjunctiva/sclera: Conjunctivae normal.     Pupils: Pupils are equal, round, and reactive to light.  Neck:     Comments: No meningismus. Cardiovascular:     Rate and Rhythm: Normal rate and regular rhythm.     Heart sounds: Normal heart sounds. No murmur heard. Pulmonary:     Effort: Pulmonary effort is normal. No respiratory distress.     Breath sounds: Normal breath sounds.  Abdominal:     Palpations: Abdomen is soft.     Tenderness: There is abdominal tenderness. There is no guarding or rebound.     Comments: Right upper quadrant and epigastric tenderness, no guarding or rebound  Musculoskeletal:        General: No tenderness. Normal range of motion.     Cervical back: Normal range of motion and neck supple.  Skin:    General: Skin is warm.  Neurological:     Mental Status: He is alert and oriented to person, place, and time.     Cranial Nerves: No cranial nerve deficit.     Motor: No abnormal muscle tone.     Coordination: Coordination normal.     Comments: No ataxia on finger to nose bilaterally. No pronator drift. 5/5 strength throughout. CN 2-12 intact.Equal grip strength. Sensation intact.   Psychiatric:        Behavior: Behavior normal.     ED Results / Procedures / Treatments   Labs (all labs ordered are listed, but only abnormal results are displayed) Labs Reviewed  CBC WITH DIFFERENTIAL/PLATELET - Abnormal; Notable for the following components:      Result Value   Lymphs Abs 0.4 (*)    All other components within normal limits  COMPREHENSIVE METABOLIC PANEL - Abnormal; Notable for the following components:   Glucose, Bld 104 (*)    Alkaline  Phosphatase 37 (*)    Total Bilirubin 1.3 (*)    All other components within normal limits  URINALYSIS, ROUTINE W REFLEX MICROSCOPIC - Abnormal; Notable for the following components:   APPearance HAZY (*)    Hgb urine dipstick LARGE (*)    Protein, ur 30 (*)    Leukocytes,Ua TRACE (*)    RBC / HPF >50 (*)    Bacteria, UA FEW (*)    All other components within normal limits  URINE CULTURE  LIPASE, BLOOD    EKG None  Radiology No results found.  Procedures Procedures  Medications Ordered in ED Medications  lactated ringers bolus 1,000 mL (has no administration in time range)  ondansetron (ZOFRAN-ODT) disintegrating tablet 4 mg (4 mg Oral Given 06/18/22 0923)  ondansetron (ZOFRAN) injection 4 mg (4 mg Intravenous Given 06/18/22 1544)    ED Course/ Medical Decision Making/ A&P                           Medical Decision Making Amount and/or Complexity of Data Reviewed Labs: ordered. Decision-making details documented in ED Course. Radiology: ordered and independent interpretation performed. Decision-making details documented in ED Course. ECG/medicine tests: ordered and independent interpretation performed. Decision-making details documented in ED Course.  Risk Prescription drug management.  Upper abdominal pain with nausea vomiting diarrhea since 4 AM.  Vital stable, no distress, abdomen soft without peritoneal signs.  Patient be given IV fluids and antiemetics.  Labs are reassuring with no leukocytosis.  LFTs and lipase are normal.  UA with blood and bacteria, will send culture, no UTI symptoms.  Suspect likely viral gastroenteritis.  Will obtain imaging given patient's immunocompromise state.  P.o. challenge and CT scan pending at shift change.  Dr. Donnald Garre to assume care         Final Clinical Impression(s) / ED Diagnoses Final diagnoses:  Gastroenteritis    Rx / DC Orders ED Discharge Orders     None         Ephram Kornegay, Jeannett Senior, MD 06/18/22  1643

## 2022-06-18 NOTE — ED Notes (Signed)
Pt tolerating PO fluids

## 2022-06-18 NOTE — ED Notes (Signed)
Pt given beverage for fluid challenge

## 2022-06-19 LAB — URINE CULTURE: Culture: <10000 — AB

## 2023-01-13 ENCOUNTER — Emergency Department (HOSPITAL_COMMUNITY)
Admission: EM | Admit: 2023-01-13 | Discharge: 2023-01-14 | Disposition: A | Discharge status: Home / Self Care | Payer: 59 | Attending: Student | Admitting: Student

## 2023-01-13 ENCOUNTER — Other Ambulatory Visit: Payer: Self-pay

## 2023-01-13 DIAGNOSIS — Z21 Asymptomatic human immunodeficiency virus [HIV] infection status: Secondary | ICD-10-CM | POA: Insufficient documentation

## 2023-01-13 DIAGNOSIS — R112 Nausea with vomiting, unspecified: Secondary | ICD-10-CM

## 2023-01-13 DIAGNOSIS — R8289 Other abnormal findings on cytological and histological examination of urine: Secondary | ICD-10-CM | POA: Diagnosis not present

## 2023-01-13 DIAGNOSIS — N179 Acute kidney failure, unspecified: Secondary | ICD-10-CM | POA: Insufficient documentation

## 2023-01-13 DIAGNOSIS — J45909 Unspecified asthma, uncomplicated: Secondary | ICD-10-CM | POA: Diagnosis not present

## 2023-01-13 DIAGNOSIS — R1110 Vomiting, unspecified: Secondary | ICD-10-CM | POA: Insufficient documentation

## 2023-01-13 DIAGNOSIS — R197 Diarrhea, unspecified: Secondary | ICD-10-CM | POA: Insufficient documentation

## 2023-01-13 DIAGNOSIS — R6883 Chills (without fever): Secondary | ICD-10-CM | POA: Insufficient documentation

## 2023-01-13 DIAGNOSIS — R1012 Left upper quadrant pain: Secondary | ICD-10-CM | POA: Diagnosis present

## 2023-01-13 LAB — LIPASE, BLOOD: Lipase: 28 U/L (ref 11–51)

## 2023-01-13 LAB — COMPREHENSIVE METABOLIC PANEL
ALT: 20 U/L (ref 0–44)
AST: 23 U/L (ref 15–41)
Albumin: 5 g/dL (ref 3.5–5.0)
Alkaline Phosphatase: 60 U/L (ref 38–126)
Anion gap: 16 — ABNORMAL HIGH (ref 5–15)
BUN: 17 mg/dL (ref 6–20)
CO2: 22 mmol/L (ref 22–32)
Calcium: 10 mg/dL (ref 8.9–10.3)
Chloride: 99 mmol/L (ref 98–111)
Creatinine, Ser: 2.1 mg/dL — ABNORMAL HIGH (ref 0.61–1.24)
GFR, Estimated: 43 mL/min — ABNORMAL LOW (ref >60)
Glucose, Bld: 130 mg/dL — ABNORMAL HIGH (ref 70–99)
Potassium: 3.6 mmol/L (ref 3.5–5.1)
Sodium: 137 mmol/L (ref 135–145)
Total Bilirubin: 0.9 mg/dL (ref 0.3–1.2)
Total Protein: 8.3 g/dL — ABNORMAL HIGH (ref 6.5–8.1)

## 2023-01-13 LAB — URINALYSIS, ROUTINE W REFLEX MICROSCOPIC
Bilirubin Urine: NEGATIVE
Glucose, UA: NEGATIVE mg/dL
Hgb urine dipstick: NEGATIVE
Ketones, ur: 5 mg/dL — AB
Leukocytes,Ua: NEGATIVE
Nitrite: NEGATIVE
Protein, ur: 100 mg/dL — AB
Specific Gravity, Urine: 1.028 (ref 1.005–1.030)
pH: 5 (ref 5.0–8.0)

## 2023-01-13 LAB — CBC
HCT: 50.6 % (ref 39.0–52.0)
Hemoglobin: 17.5 g/dL — ABNORMAL HIGH (ref 13.0–17.0)
MCH: 28.3 pg (ref 26.0–34.0)
MCHC: 34.6 g/dL (ref 30.0–36.0)
MCV: 81.9 fL (ref 80.0–100.0)
Platelets: 239 10*3/uL (ref 150–400)
RBC: 6.18 MIL/uL — ABNORMAL HIGH (ref 4.22–5.81)
RDW: 12.9 % (ref 11.5–15.5)
WBC: 4.4 10*3/uL (ref 4.0–10.5)
nRBC: 0 % (ref 0.0–0.2)

## 2023-01-13 NOTE — ED Triage Notes (Signed)
Patient reports left abdominal pain with emesis and diarrhea onset this week , no fever or chills .

## 2023-01-14 LAB — GASTROINTESTINAL PANEL BY PCR, STOOL (REPLACES STOOL CULTURE)

## 2023-01-14 LAB — BASIC METABOLIC PANEL
Anion gap: 8 (ref 5–15)
BUN: 15 mg/dL (ref 6–20)
CO2: 22 mmol/L (ref 22–32)
Calcium: 8.4 mg/dL — ABNORMAL LOW (ref 8.9–10.3)
Chloride: 106 mmol/L (ref 98–111)
Creatinine, Ser: 1.51 mg/dL — ABNORMAL HIGH (ref 0.61–1.24)
GFR, Estimated: >60 mL/min (ref >60)
Glucose, Bld: 105 mg/dL — ABNORMAL HIGH (ref 70–99)
Potassium: 3.3 mmol/L — ABNORMAL LOW (ref 3.5–5.1)
Sodium: 136 mmol/L (ref 135–145)

## 2023-01-14 LAB — C DIFFICILE QUICK SCREEN W PCR REFLEX
C Diff antigen: NEGATIVE
C Diff interpretation: NOT DETECTED
C Diff toxin: NEGATIVE

## 2023-01-14 MED ORDER — SODIUM CHLORIDE 0.9 % IV BOLUS
1000.0000 mL | Freq: Once | INTRAVENOUS | Status: AC
  Administered 2023-01-14: 1000 mL via INTRAVENOUS

## 2023-01-14 MED ORDER — ONDANSETRON HCL 4 MG/2ML IJ SOLN
4.0000 mg | Freq: Once | INTRAMUSCULAR | Status: AC
  Administered 2023-01-14: 4 mg via INTRAVENOUS
  Filled 2023-01-14: qty 2

## 2023-01-14 MED ORDER — DICYCLOMINE HCL 10 MG PO CAPS
10.0000 mg | ORAL_CAPSULE | Freq: Once | ORAL | Status: AC
  Administered 2023-01-14: 10 mg via ORAL
  Filled 2023-01-14: qty 1

## 2023-01-14 MED ORDER — ONDANSETRON HCL 4 MG PO TABS: 4.0000 mg | ORAL_TABLET | Freq: Four times a day (QID) | ORAL | 0 refills | Status: AC

## 2023-01-14 NOTE — Discharge Instructions (Signed)
You were seen in the emergency department today for nausea, vomiting and diarrhea. I think this is likely a viral illness. I am prescribing you Zofran for nausea that you can take every 6 hours. You can continue to take Imodium at home for diarrhea symptoms. Please return if you begin having abdominal pain with fever, worsening symptoms or bloody diarrhea.

## 2023-01-14 NOTE — ED Provider Notes (Signed)
[place] EMERGENCY DEPARTMENT AT [facility] Provider Note   CSN: [id number] Arrival date & time: 01/13/23  2259     History  Chief Complaint  Patient presents with   Abdominal Pain   Emesis    Diarrhea    Charles Dixon is a 32 y.o. male.  With past medical history of asthma, ADHD, HIV, subarachnoid hemorrhage who presents to the emergency department with abdominal pain.  States onset of symptoms Saturday evening.  States that he was in his normal state of health and Saturday evening began having sudden onset of watery diarrhea.  States that this persisted throughout the weekend until Monday when he began having vomiting.  States that he vomited about 6 times on Monday and then another 3 times today.  He describes having left upper quadrant abdominal pain that is cramping in nature.  It occasionally radiates across the top of his abdomen.  He has noted having some chills but no objective fever.  He denies having eaten possibly contaminated food.  No recent travel.  No dysuria, penile discharge, bloody diarrhea.  He has history of HIV and is compliant on his antiretroviral therapy.  No recent antibiotics.     Abdominal Pain Associated symptoms: diarrhea and vomiting   Emesis Associated symptoms: abdominal pain and diarrhea        Home Medications Prior to Admission medications   Medication Sig Start Date End Date Taking? Authorizing Provider  ondansetron (ZOFRAN) 4 MG tablet Take 1 tablet (4 mg total) by mouth every 6 (six) hours. 01/14/23  Yes Cristopher Peru, PA-C  amphetamine-dextroamphetamine (ADDERALL XR) 20 MG 24 hr capsule Take 40 mg by mouth every morning. 02/19/21   [provider]  bictegravir-emtricitabine-tenofovir AF (BIKTARVY) 50-200-25 MG TABS tablet Take 1 tablet by mouth daily.    [provider]  DANDELION ROOT PO Take 2 capsules by mouth daily.    [provider]  FIBER PO Take 1 capsule by mouth in the morning.    [provider]  Ginger, Zingiber officinalis, (GINGER ROOT) 500 MG CAPS Take 2 capsules by mouth daily.    [provider]  Misc Natural Products (OSTEO BI-FLEX ADV DOUBLE ST PO) Take 2 capsules by mouth daily.    [provider]  nortriptyline (PAMELOR) 75 MG capsule Take 75 mg by mouth at bedtime. 03/17/21   [provider]  ondansetron (ZOFRAN-ODT) 4 MG disintegrating tablet Take 1 tablet (4 mg total) by mouth every 8 (eight) hours as needed for nausea or vomiting. 06/18/22   Rancour, Jeannett Senior, MD  OVER THE COUNTER MEDICATION Take 2 capsules by mouth daily. Marshmallow Root    [provider]  OVER THE COUNTER MEDICATION Take 2 capsules by mouth in the morning and at bedtime. [facility]    [provider]  OVER THE COUNTER MEDICATION Take 1 capsule by mouth in the morning and at bedtime. Neem    [provider]  OVER THE COUNTER MEDICATION Take 2 capsules by mouth daily. [facility]    [provider]  OVER THE COUNTER MEDICATION Take 2 capsules by mouth daily. Nettle    [provider]  OVER THE COUNTER MEDICATION Take 2 capsules by mouth daily. Burdock Root    [provider]  RED CLOVER LEAF EXTRACT PO Take 2 capsules by mouth daily.    [provider]  traZODone (DESYREL) 50 MG tablet Take 1 tablet (50 mg total) by mouth at bedtime. 05/08/21  Rodolph Bong, MD      Allergies    Penicillins and Other    Review of Systems   Review of Systems  Gastrointestinal:  Positive for abdominal pain, diarrhea and vomiting.  All other systems reviewed and are negative.   Physical Exam Updated Vital Signs BP 124/76   Pulse 87   Temp 98 F (36.7 C)   Resp 16   SpO2 98%  Physical Exam Vitals and nursing note reviewed.  Constitutional:      General: He is not in acute distress.    Appearance: Normal appearance. He is well-developed. He is not ill-appearing or toxic-appearing.  HENT:     Head:  Normocephalic.  Eyes:     General: No scleral icterus. Cardiovascular:     Rate and Rhythm: Normal rate and regular rhythm.     Heart sounds: Normal heart sounds. No murmur heard. Pulmonary:     Effort: Pulmonary effort is normal. No respiratory distress.     Breath sounds: Normal breath sounds.  Abdominal:     General: Abdomen is protuberant. Bowel sounds are normal. There is no distension.     Palpations: Abdomen is soft.     Tenderness: There is abdominal tenderness in the epigastric area and left upper quadrant. There is no guarding or rebound.     Comments: Mild abdominal tenderness to deep palpation  Skin:    General: Skin is warm and dry.     Capillary Refill: Capillary refill takes less than 2 seconds.  Neurological:     General: No focal deficit present.     Mental Status: He is alert and oriented to person, place, and time.  Psychiatric:        Mood and Affect: Mood normal.        Behavior: Behavior normal.     ED Results / Procedures / Treatments   Labs (all labs ordered are listed, but only abnormal results are displayed) Labs Reviewed  COMPREHENSIVE METABOLIC PANEL - Abnormal; Notable for the following components:      Result Value   Glucose, Bld 130 (*)    Creatinine, Ser 2.10 (*)    Total Protein 8.3 (*)    GFR, Estimated 43 (*)    Anion gap 16 (*)    All other components within normal limits  CBC - Abnormal; Notable for the following components:   RBC 6.18 (*)    Hemoglobin 17.5 (*)    All other components within normal limits  URINALYSIS, ROUTINE W REFLEX MICROSCOPIC - Abnormal; Notable for the following components:   Color, Urine AMBER (*)    APPearance CLOUDY (*)    Ketones, ur 5 (*)    Protein, ur 100 (*)    Bacteria, UA FEW (*)    All other components within normal limits  BASIC METABOLIC PANEL - Abnormal; Notable for the following components:   Potassium 3.3 (*)    Glucose, Bld 105 (*)    Creatinine, Ser 1.51 (*)    Calcium 8.4 (*)    All  other components within normal limits  C DIFFICILE QUICK SCREEN W PCR REFLEX    URINE CULTURE  GASTROINTESTINAL PANEL BY PCR, STOOL (REPLACES STOOL CULTURE)  LIPASE, BLOOD    EKG None  Radiology No results found.  Procedures Procedures   Medications Ordered in ED Medications  sodium chloride 0.9 % bolus 1,000 mL (0 mLs Intravenous Stopped 01/14/23 0238)  ondansetron (ZOFRAN) injection 4 mg (4 mg Intravenous Given 01/14/23 0125)  dicyclomine (BENTYL) capsule 10 mg (10 mg Oral Given 01/14/23 0120)  sodium chloride 0.9 % bolus 1,000 mL (1,000 mLs Intravenous Bolus 01/14/23 0239)    ED Course/ Medical Decision Making/ A&P    Medical Decision Making Amount and/or Complexity of Data Reviewed Labs: ordered.  Risk Prescription drug management.  Initial Impression and Ddx 32 year old male who presents to the emergency department with diarrhea and vomiting Patient PMH that increases complexity of ED encounter: HIV, asthma Differential: Acute hepatobiliary disease, pancreatitis, appendicitis, PUD, gastritis, SBO, diverticulitis, colitis, viral gastroenteritis, Crohn's, UC, vascular catastrophe, UTI, pyelonephritis, renal stone, obstructed stone, infected stone, testicular torsion, epididymitis, incarcerated hernia, STD, etc.    Interpretation of Diagnostics I independent reviewed and interpreted the labs as followed: CMP with AKI of creatinine of 2.1, CMP without leukopenia or leukocytosis, appears to be hemoconcentrated.  Lipase is negative.  UA is cloudy with 6-10 WBCs, few bacteria.  Sending urine culture. Cdiff negative. GIPP pending  - I independently visualized the following imaging with scope of interpretation limited to determining acute life threatening conditions related to emergency care: not indicated,   Patient Reassessment and Ultimate Disposition/Management 31 year old male who presents to the emergency department with diarrhea, vomiting, abdominal cramping.  Overall  well-appearing, hemodynamically stable and afebrile.  He had very mild tenderness to deep palpation of the left upper quadrant epigastrium. Labs were obtained prior to him being moved to an ED bed.  These demonstrate mild AKI with creatinine 2.1.  CBC appears to be hemoconcentrated.  Likely prerenal AKI from dehydration.  Giving IV fluids. Urine has 6-10 WBCs, few bacteria.  Given HIV status we will send off urine culture.  I am also ordering a C. difficile and GI pathogen panel given HIV status but he has not had diarrhea since yesterday after taking Imodium. Will reassess after fluids, Zofran, Bentyl.  Giving 2nd liter fluids for presumed prerenal aki. Will recheck BMP after.   BMP recheck with renal function down to 1.5. improved with fluids.  Cdiff negative  GIPP pending   I think this is likely a viral gastroenteritis. His abdomen is not tender on exam, only to deep palpation. He has not had vomiting or diarrhea here and tolerating po. Doubt a bacterial infectious diarrhea without bloody diarrhea or fever. Symptoms are inconsistent with acute hepatobiliary disease, pancreatitis, appenditicits, diverticulitis. No urinary symptoms so doubt uti, stone, obstructed stone, infected stone, pyelo, SBO  Discharging with Zofran. Can continue Imodium at home. Return precautions discussed.  The patient has been appropriately medically screened and/or stabilized in the ED. I have low suspicion for any other emergent medical condition which would require further screening, evaluation or treatment in the ED or require inpatient management. At time of discharge the patient is hemodynamically stable and in no acute distress. I have discussed work-up results and diagnosis with patient and answered all questions. Patient is agreeable with discharge plan. We discussed strict return precautions for returning to the emergency department and they verbalized understanding.     Patient management required discussion  with the following services or consulting groups:  None  Complexity of Problems Addressed Acute complicated illness or Injury  Additional Data Reviewed and Analyzed Further history obtained from: Further history from spouse/family member, Past medical history and medications listed in the EMR, Prior ED visit notes, and Care Everywhere  Patient Encounter Risk Assessment Prescriptions and SDOH impact on management  Final Clinical Impression(s) / ED Diagnoses Final diagnoses:  Nausea vomiting and diarrhea    Rx /  DC Orders ED Discharge Orders          Ordered    ondansetron (ZOFRAN) 4 MG tablet  Every 6 hours        01/14/23 0411              Cristopher Peru, PA-C 01/14/23 0411    Kommor, Wyn Forster, MD 01/14/23 2052

## 2023-01-15 LAB — URINE CULTURE: Culture: NO GROWTH

## 2024-04-27 ENCOUNTER — Other Ambulatory Visit: Payer: Self-pay | Admitting: Medical Genetics

## 2024-07-27 ENCOUNTER — Other Ambulatory Visit: Payer: Self-pay | Admitting: Medical Genetics

## 2024-07-27 DIAGNOSIS — Z006 Encounter for examination for normal comparison and control in clinical research program: Secondary | ICD-10-CM
